# Patient Record
Sex: Female | Born: 1950 | Race: Black or African American | Hispanic: No | Marital: Single
Health system: Southern US, Community
[De-identification: ages and names within clinical notes are randomized; demographics above are authoritative.]

---

## 2008-09-10 ENCOUNTER — Emergency Department (HOSPITAL_BASED_OUTPATIENT_CLINIC_OR_DEPARTMENT_OTHER): Admission: EM | Admit: 2008-09-10 | Discharge: 2008-09-10 | Payer: Self-pay | Admitting: Emergency Medicine

## 2008-09-10 ENCOUNTER — Ambulatory Visit: Payer: Self-pay | Admitting: Diagnostic Radiology

## 2009-02-25 ENCOUNTER — Ambulatory Visit: Payer: Self-pay | Admitting: Diagnostic Radiology

## 2009-02-25 ENCOUNTER — Emergency Department (HOSPITAL_BASED_OUTPATIENT_CLINIC_OR_DEPARTMENT_OTHER): Admission: EM | Admit: 2009-02-25 | Discharge: 2009-02-25 | Payer: Self-pay | Admitting: Emergency Medicine

## 2011-01-05 IMAGING — CR DG FOOT COMPLETE 3+V*L*
3 series · 3 of 3 positions shown · non-contrast
Comparison: None.

CLINICAL DATA: Palpable knot along the plantar base of the first
metatarsal for 3 days.  No known injury.

LEFT FOOT - COMPLETE 3+ VIEW

[t foot ap left]
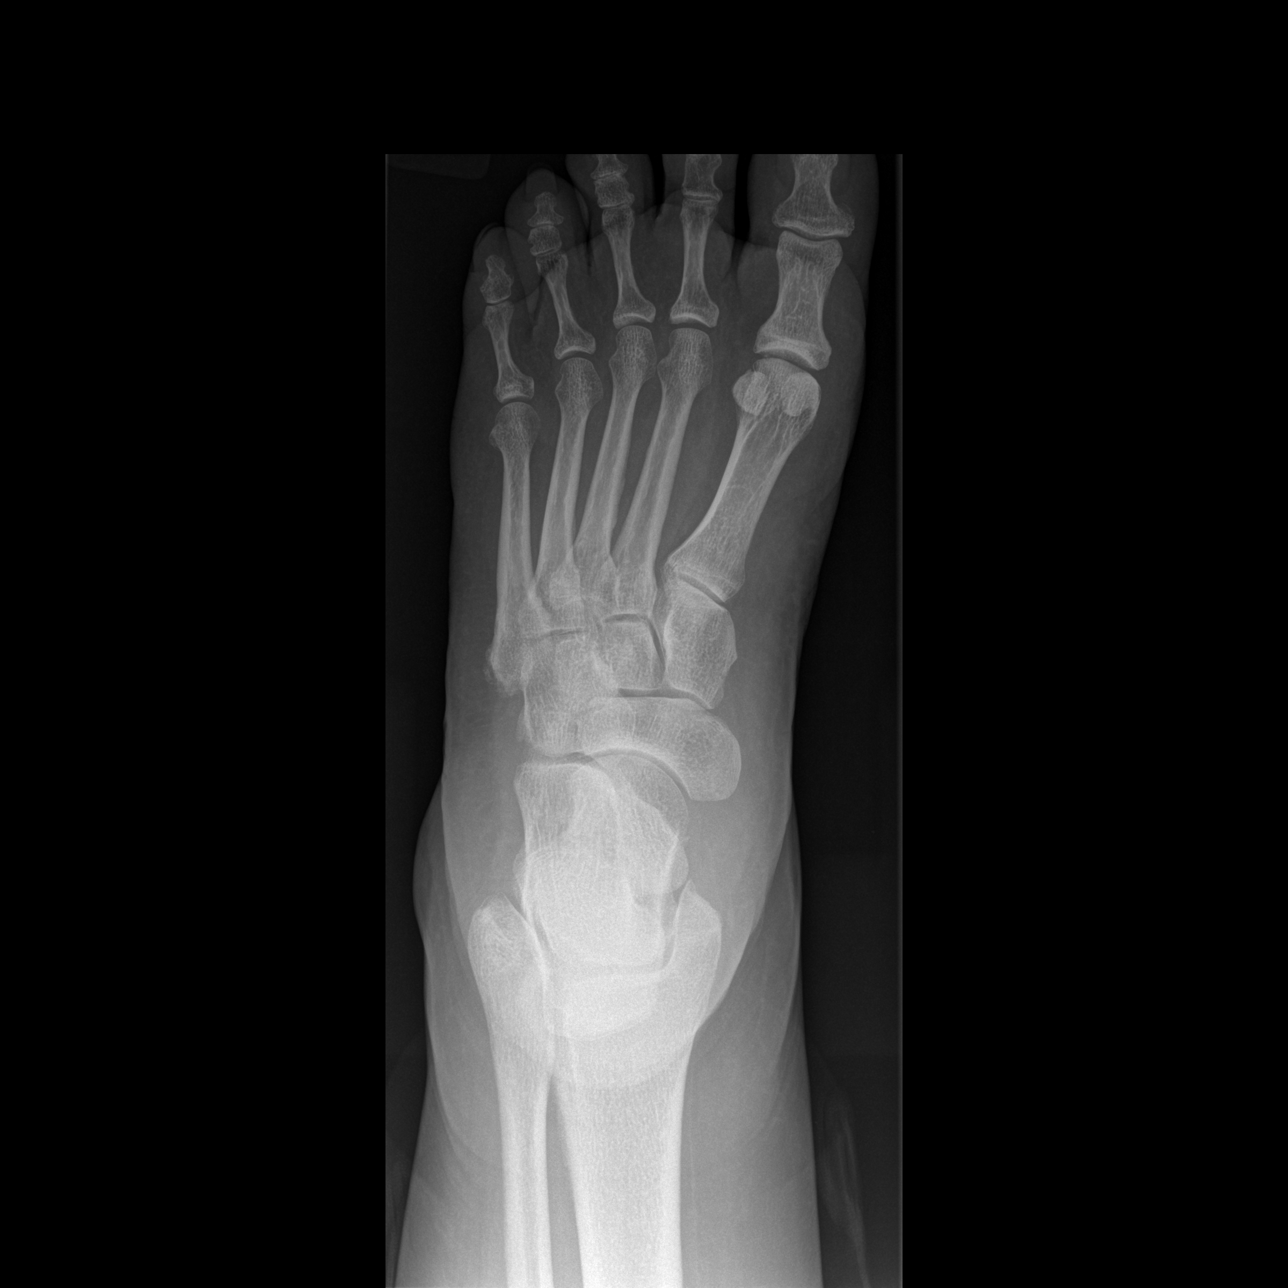

[t foot oblique left]
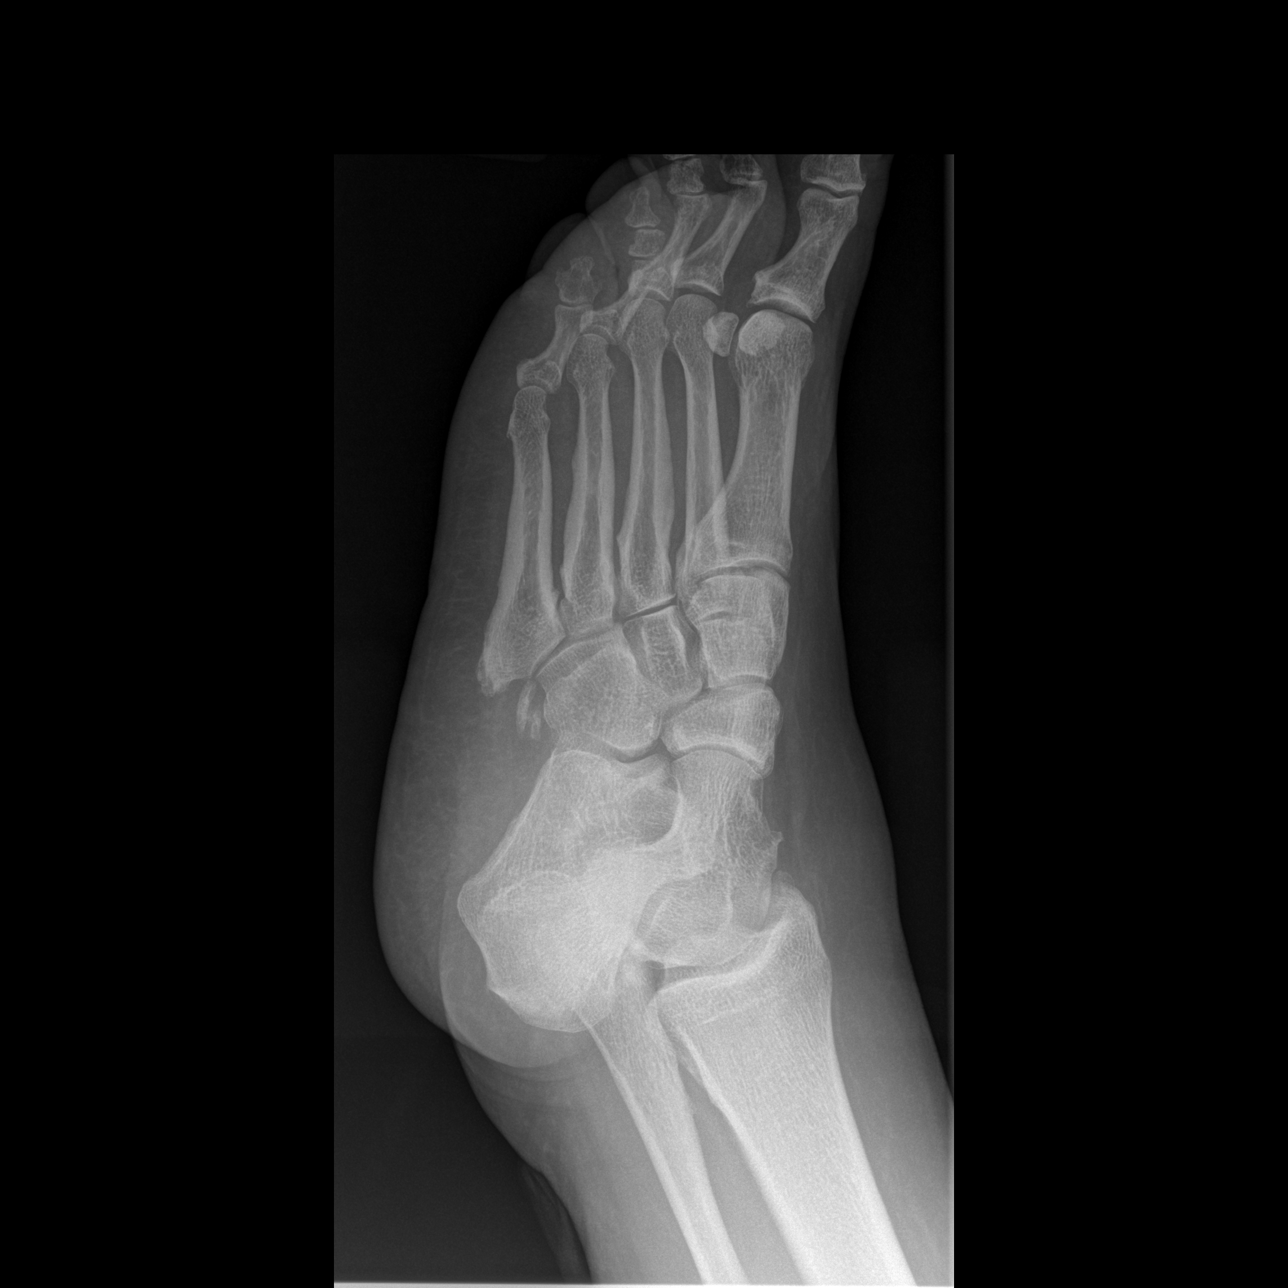

[t foot lat left]
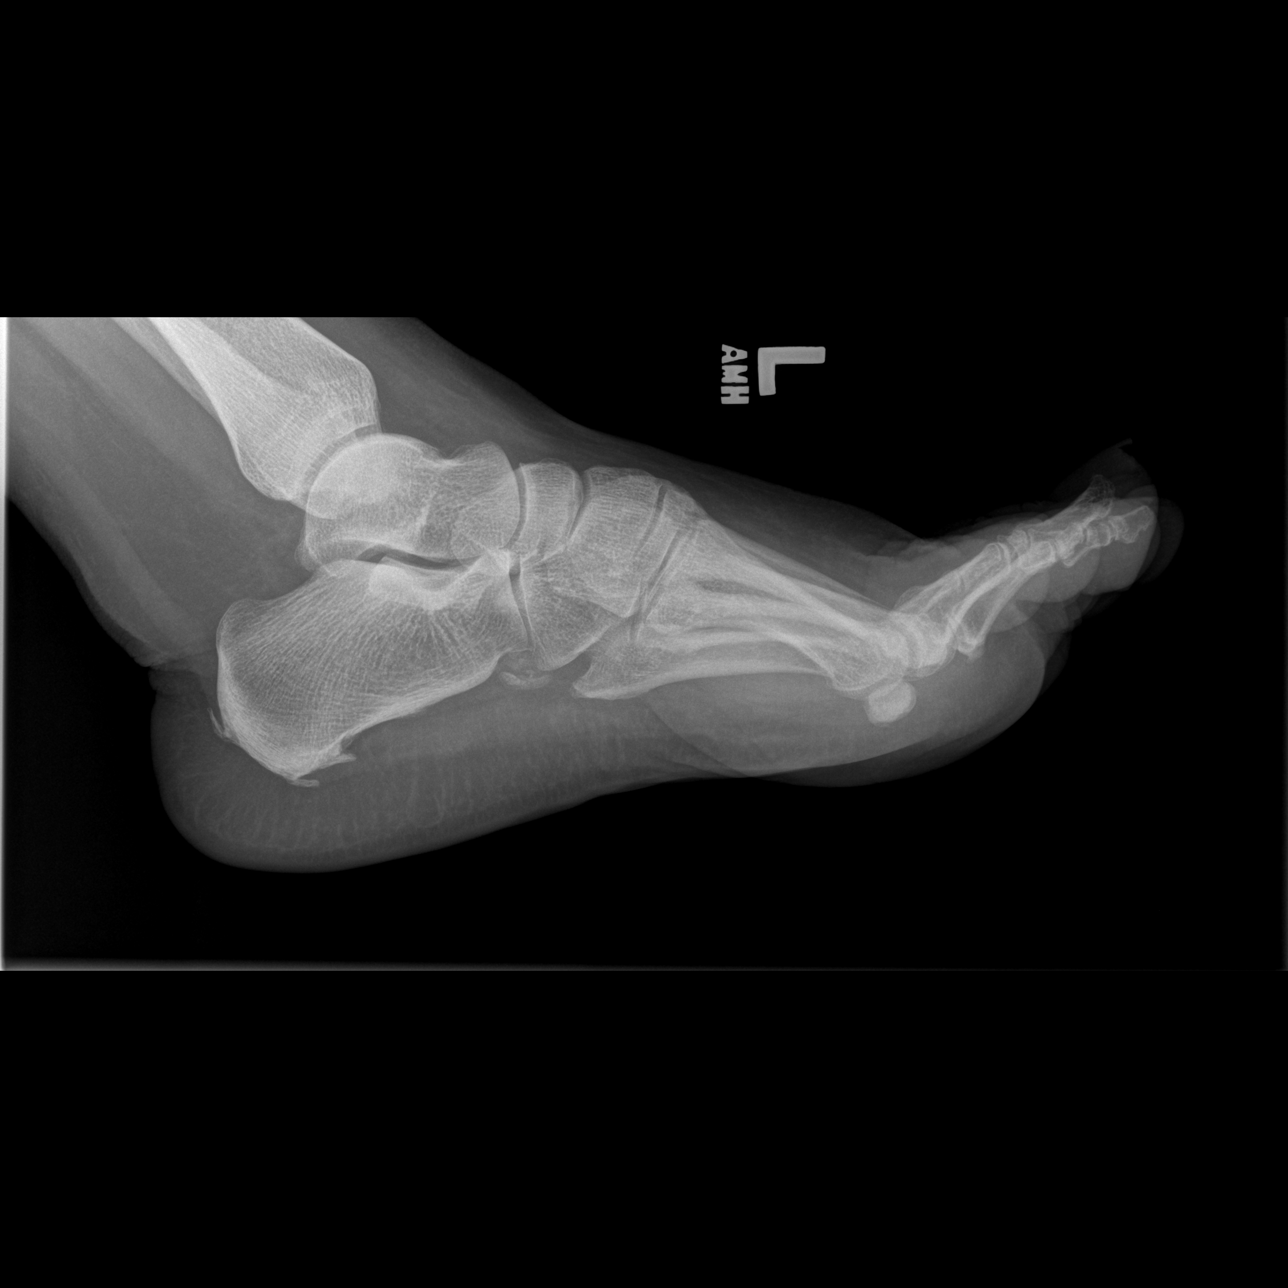

[3 of 3 positions shown; findings below may reference images not displayed]

FINDINGS: Mineralization and alignment are normal.  There is no
evidence of acute fracture or dislocation.  There are plantar and
dorsal calcaneal spurs.  There is mild spurring at the base of the
fifth metatarsal.  There is no radiographic explanation for the
patient's palpable concern.
IMPRESSION: No acute osseous findings or explanation for the patient's palpable
concern.  Clinical follow-up of any palpable concern is
recommended.  If further evaluation is warranted, MRI should be
considered.

## 2014-09-18 ENCOUNTER — Telehealth: Payer: Self-pay | Admitting: *Deleted

## 2014-12-22 NOTE — Telephone Encounter (Signed)
Error

## 2020-07-31 ENCOUNTER — Other Ambulatory Visit: Payer: Self-pay

## 2020-07-31 ENCOUNTER — Emergency Department (HOSPITAL_BASED_OUTPATIENT_CLINIC_OR_DEPARTMENT_OTHER)
Admission: EM | Admit: 2020-07-31 | Discharge: 2020-07-31 | Disposition: A | Discharge status: Home / Self Care | Payer: Medicare HMO | Attending: Emergency Medicine | Admitting: Emergency Medicine

## 2020-07-31 DIAGNOSIS — U071 COVID-19: Secondary | ICD-10-CM

## 2020-07-31 DIAGNOSIS — R519 Headache, unspecified: Secondary | ICD-10-CM | POA: Diagnosis present

## 2020-07-31 LAB — RESP PANEL BY RT-PCR (FLU A&B, COVID) ARPGX2
Influenza A by PCR: NEGATIVE
Influenza B by PCR: NEGATIVE
SARS Coronavirus 2 by RT PCR: POSITIVE — AB

## 2020-07-31 MED ORDER — ACETAMINOPHEN 500 MG PO TABS
1000.0000 mg | ORAL_TABLET | Freq: Once | ORAL | Status: AC
  Administered 2020-07-31: 1000 mg via ORAL
  Filled 2020-07-31: qty 2

## 2020-07-31 NOTE — ED Triage Notes (Signed)
Pt reports headache that started yesterday. Report being exposed to COVID 1 week ago.

## 2020-07-31 NOTE — ED Notes (Signed)
Pt wheeled to waiting room. Pt verbalized understanding of discharge instructions.   

## 2020-07-31 NOTE — ED Provider Notes (Signed)
[facility] EMERGENCY DEPARTMENT Provider Note   CSN: [id number] Arrival date & time: 07/31/20  2952     History Chief Complaint  Patient presents with   Headache    Christine Wilkinson is a 70 y.o. female.  HPI Patient reports that she has been spending a lot of time in bed over the past week.  She reports part of that is because she has bad knees and legs and is inactive at baseline.  Reports she uses a cane because of her bad knees.  However, she reports she has been more fatigued for about a week.  She was exposed to her daughter who has COVID.  She lives with her daughter.  Patient reports that yesterday she started getting a headache.  She reports it is a generalized headache on the top of her head.  She is not aware that she had a fever but she does have fever 100.8 in the emergency department.  Patient denies any nausea or vomiting.  She has not tried anything for the headache.  She denies is very bad right now but was worse yesterday.  No sore throat or nasal congestion.  No coughing or chest pain.  No abdominal pain nausea or vomiting.  Patient reports she came in because she is concerned at her age and with her medical problems if she had COVID.    No past medical history on file.  There are no problems to display for this patient.      OB History   No obstetric history on file.     No family history on file.     Home Medications Prior to Admission medications   Not on File    Allergies    Patient has no allergy information on record.  Review of Systems   Review of Systems 10 systems reviewed and negative except as per HPI Physical Exam Updated Vital Signs BP (!) 167/94   Pulse 100   Temp (!) 100.8 F (38.2 C) (Oral)   Resp 18   Ht 5\' 5"  (1.651 m)   Wt 119.7 kg   SpO2 97%   BMI 43.93 kg/m   Physical Exam Constitutional:      Comments: Alert nontoxic.  No respiratory distress.  Mental status clear.  HENT:     Right Ear: Tympanic membrane  normal.     Left Ear: Tympanic membrane normal.     Nose: Nose normal.     Mouth/Throat:     Mouth: Mucous membranes are moist.     Pharynx: Oropharynx is clear.  Eyes:     Extraocular Movements: Extraocular movements intact.     Conjunctiva/sclera: Conjunctivae normal.     Pupils: Pupils are equal, round, and reactive to light.  Cardiovascular:     Rate and Rhythm: Normal rate and regular rhythm.     Pulses: Normal pulses.  Pulmonary:     Effort: Pulmonary effort is normal.     Breath sounds: Normal breath sounds.  Abdominal:     General: There is no distension.     Palpations: Abdomen is soft.     Tenderness: There is no abdominal tenderness. There is no guarding.  Musculoskeletal:        General: No swelling.     Cervical back: Neck supple.     Right lower leg: No edema.     Left lower leg: No edema.     Comments: Condition of feet and lower legs is good.  Patient does have  obesity but no edema, erythema or swelling.  Skin:    General: Skin is warm and dry.  Neurological:     General: No focal deficit present.     Mental Status: She is oriented to person, place, and time.     Coordination: Coordination normal.  Psychiatric:        Mood and Affect: Mood normal.    ED Results / Procedures / Treatments   Labs (all labs ordered are listed, but only abnormal results are displayed) Labs Reviewed  RESP PANEL BY RT-PCR (FLU A&B, COVID) ARPGX2 - Abnormal; Notable for the following components:      Result Value   SARS Coronavirus 2 by RT PCR POSITIVE (*)    All other components within normal limits    EKG None  Radiology No results found.  Procedures Procedures   Medications Ordered in ED Medications  acetaminophen (TYLENOL) tablet 1,000 mg (1,000 mg Oral Given 07/31/20 6734)    ED Course  I have reviewed the triage vital signs and the nursing notes.  Pertinent labs & imaging results that were available during my care of the patient were reviewed by me and  considered in my medical decision making (see chart for details).    MDM Rules/Calculators/A&P                           Patient presents with concerns for having COVID.  She is minimally symptomatic with headache.  Headache came and went yesterday.  Today she endorses mild headache.  Patient's lungs are clear.  Oxygen saturation is normal, patient does not have subjective shortness of breath or chest pain.  She has been eating and drinking at baseline.  Symptoms onset appear to be at least 5 days ago.  At this time does not have any findings that suggest need for admission.  Stable for symptomatic treatment with careful return precautions. Final Clinical Impression(s) / ED Diagnoses Final diagnoses:  Acute nonintractable headache, unspecified headache type  COVID    Rx / DC Orders ED Discharge Orders     None        Arby Barrette, MD 07/31/20 1056

## 2020-07-31 NOTE — Discharge Instructions (Addendum)
1.  At this time you are doing well with your COVID symptoms.  Your oxygen level is normal.  You have no signs of coughing or shortness of breath or chest pain.  You are eating and drinking normally. 2.  Take extra strength Tylenol every 6 hours for headache, fever and body aches.  Stay hydrated.  Try to eat small healthy snacks. 3.  Schedule a follow-up appointment with your doctor in 5 to 7 days. 4.  Return to the emergency department if you are developing worsening symptoms.  Return if you are vomiting, cannot eat and drink, developing shortness of breath or chest pain.
